# Patient Record
Sex: Male | Born: 1955 | Race: White | Hispanic: No | Marital: Single | Smoking: Former smoker
Health system: Southern US, Community
[De-identification: ages and names within clinical notes are randomized; demographics above are authoritative.]

## PROBLEM LIST (undated history)

## (undated) DIAGNOSIS — H409 Unspecified glaucoma: Secondary | ICD-10-CM

## (undated) DIAGNOSIS — K219 Gastro-esophageal reflux disease without esophagitis: Secondary | ICD-10-CM

## (undated) DIAGNOSIS — T7840XA Allergy, unspecified, initial encounter: Secondary | ICD-10-CM

## (undated) DIAGNOSIS — E785 Hyperlipidemia, unspecified: Secondary | ICD-10-CM

## (undated) HISTORY — DX: Allergy, unspecified, initial encounter: T78.40XA

## (undated) HISTORY — PX: EYE SURGERY: SHX253

## (undated) HISTORY — PX: CARPAL TUNNEL RELEASE: SHX101

## (undated) HISTORY — PX: AMPUTATION: SHX166

## (undated) HISTORY — DX: Hyperlipidemia, unspecified: E78.5

## (undated) HISTORY — DX: Unspecified glaucoma: H40.9

## (undated) HISTORY — DX: Gastro-esophageal reflux disease without esophagitis: K21.9

---

## 1997-03-24 DIAGNOSIS — C801 Malignant (primary) neoplasm, unspecified: Secondary | ICD-10-CM

## 1997-03-24 HISTORY — DX: Malignant (primary) neoplasm, unspecified: C80.1

## 2006-03-31 ENCOUNTER — Emergency Department (HOSPITAL_COMMUNITY): Admission: EM | Admit: 2006-03-31 | Discharge: 2006-03-31 | Payer: Self-pay | Admitting: Family Medicine

## 2013-05-27 ENCOUNTER — Encounter: Payer: Self-pay | Admitting: Gastroenterology

## 2013-07-15 ENCOUNTER — Telehealth: Payer: Self-pay | Admitting: *Deleted

## 2013-07-15 NOTE — Telephone Encounter (Signed)
Phone call to patient regarding missed previsit appointment today 800 am. Pt in [place] and states that appointments had been scheduled by primary care provider and he was unaware. Gave him the date his colonoscopy. He will call back this afternoon to reschedule previsit when he has his calendars. Informed him if previsit was not rescheduled this afternoon, colonoscopy would also be cancelled and he would need to reschedule both appointments.

## 2013-07-20 VITALS — Ht 71.0 in | Wt 215.0 lb

## 2013-07-20 MED ORDER — SOD PICOSULFATE-MAG OX-CIT ACD 10-3.5-12 MG-GM-GM PO PACK: 1.0000 | PACK | Freq: Once | ORAL | Status: DC

## 2013-07-20 NOTE — Progress Notes (Signed)
No allergies to eggs or soy No home oxygen No diet/weight loss meds No past problems with anesthesia  Has email  Emmi instructions given for colonoscopy 

## 2013-07-21 NOTE — Progress Notes (Signed)
Pt unable to find carepartner.  Stated he was going to [place] where the physicians would not require a carepartner to stay in the waiting room the whole time. This encounter was created in error - please disregard.

## 2013-07-29 ENCOUNTER — Encounter: Payer: BC Managed Care – PPO | Admitting: Gastroenterology

## 2013-07-29 ENCOUNTER — Ambulatory Visit
Admission: RE | Admit: 2013-07-29 | Discharge: 2013-07-29 | Disposition: A | Discharge status: Home / Self Care | Payer: BC Managed Care – PPO | Source: Ambulatory Visit | Attending: Family Medicine | Admitting: Family Medicine

## 2013-07-29 ENCOUNTER — Other Ambulatory Visit: Payer: Self-pay | Admitting: Family Medicine

## 2013-07-29 DIAGNOSIS — C499 Malignant neoplasm of connective and soft tissue, unspecified: Secondary | ICD-10-CM

## 2013-07-29 DIAGNOSIS — M25521 Pain in right elbow: Secondary | ICD-10-CM

## 2013-07-29 DIAGNOSIS — M79631 Pain in right forearm: Secondary | ICD-10-CM

## 2014-08-15 ENCOUNTER — Ambulatory Visit
Admission: RE | Admit: 2014-08-15 | Discharge: 2014-08-15 | Disposition: A | Discharge status: Home / Self Care | Payer: BLUE CROSS/BLUE SHIELD | Source: Ambulatory Visit | Attending: Family Medicine | Admitting: Family Medicine

## 2014-08-15 ENCOUNTER — Other Ambulatory Visit: Payer: Self-pay | Admitting: Family Medicine

## 2014-08-15 DIAGNOSIS — C499 Malignant neoplasm of connective and soft tissue, unspecified: Secondary | ICD-10-CM

## 2017-03-18 ENCOUNTER — Ambulatory Visit
Admission: RE | Admit: 2017-03-18 | Discharge: 2017-03-18 | Disposition: A | Discharge status: Home / Self Care | Payer: BLUE CROSS/BLUE SHIELD | Source: Ambulatory Visit | Attending: Family Medicine | Admitting: Family Medicine

## 2017-03-18 ENCOUNTER — Other Ambulatory Visit: Payer: Self-pay | Admitting: Family Medicine

## 2017-03-18 DIAGNOSIS — C499 Malignant neoplasm of connective and soft tissue, unspecified: Secondary | ICD-10-CM

## 2018-12-20 ENCOUNTER — Other Ambulatory Visit: Payer: Self-pay | Admitting: Family Medicine

## 2018-12-20 ENCOUNTER — Ambulatory Visit
Admission: RE | Admit: 2018-12-20 | Discharge: 2018-12-20 | Disposition: A | Discharge status: Home / Self Care | Payer: BLUE CROSS/BLUE SHIELD | Source: Ambulatory Visit | Attending: Family Medicine | Admitting: Family Medicine

## 2018-12-20 ENCOUNTER — Other Ambulatory Visit: Payer: Self-pay

## 2018-12-20 DIAGNOSIS — R52 Pain, unspecified: Secondary | ICD-10-CM

## 2018-12-20 DIAGNOSIS — Z85831 Personal history of malignant neoplasm of soft tissue: Secondary | ICD-10-CM

## 2020-02-01 ENCOUNTER — Other Ambulatory Visit: Payer: Self-pay

## 2020-02-01 ENCOUNTER — Other Ambulatory Visit: Payer: Self-pay | Admitting: Family Medicine

## 2020-02-01 ENCOUNTER — Ambulatory Visit
Admission: RE | Admit: 2020-02-01 | Discharge: 2020-02-01 | Disposition: A | Discharge status: Home / Self Care | Payer: BC Managed Care – PPO | Source: Ambulatory Visit | Attending: Family Medicine | Admitting: Family Medicine

## 2020-02-01 DIAGNOSIS — L989 Disorder of the skin and subcutaneous tissue, unspecified: Secondary | ICD-10-CM

## 2020-02-01 DIAGNOSIS — C499 Malignant neoplasm of connective and soft tissue, unspecified: Secondary | ICD-10-CM

## 2021-03-24 IMAGING — CR DG CHEST 2V
2 series · 2 of 2 positions shown · non-contrast
Comparison: Chest x-ray 12/20/2018.

CLINICAL DATA: 64-year-old male with history of sarcoma.

EXAM:
CHEST - 2 VIEW

[w chest pa]
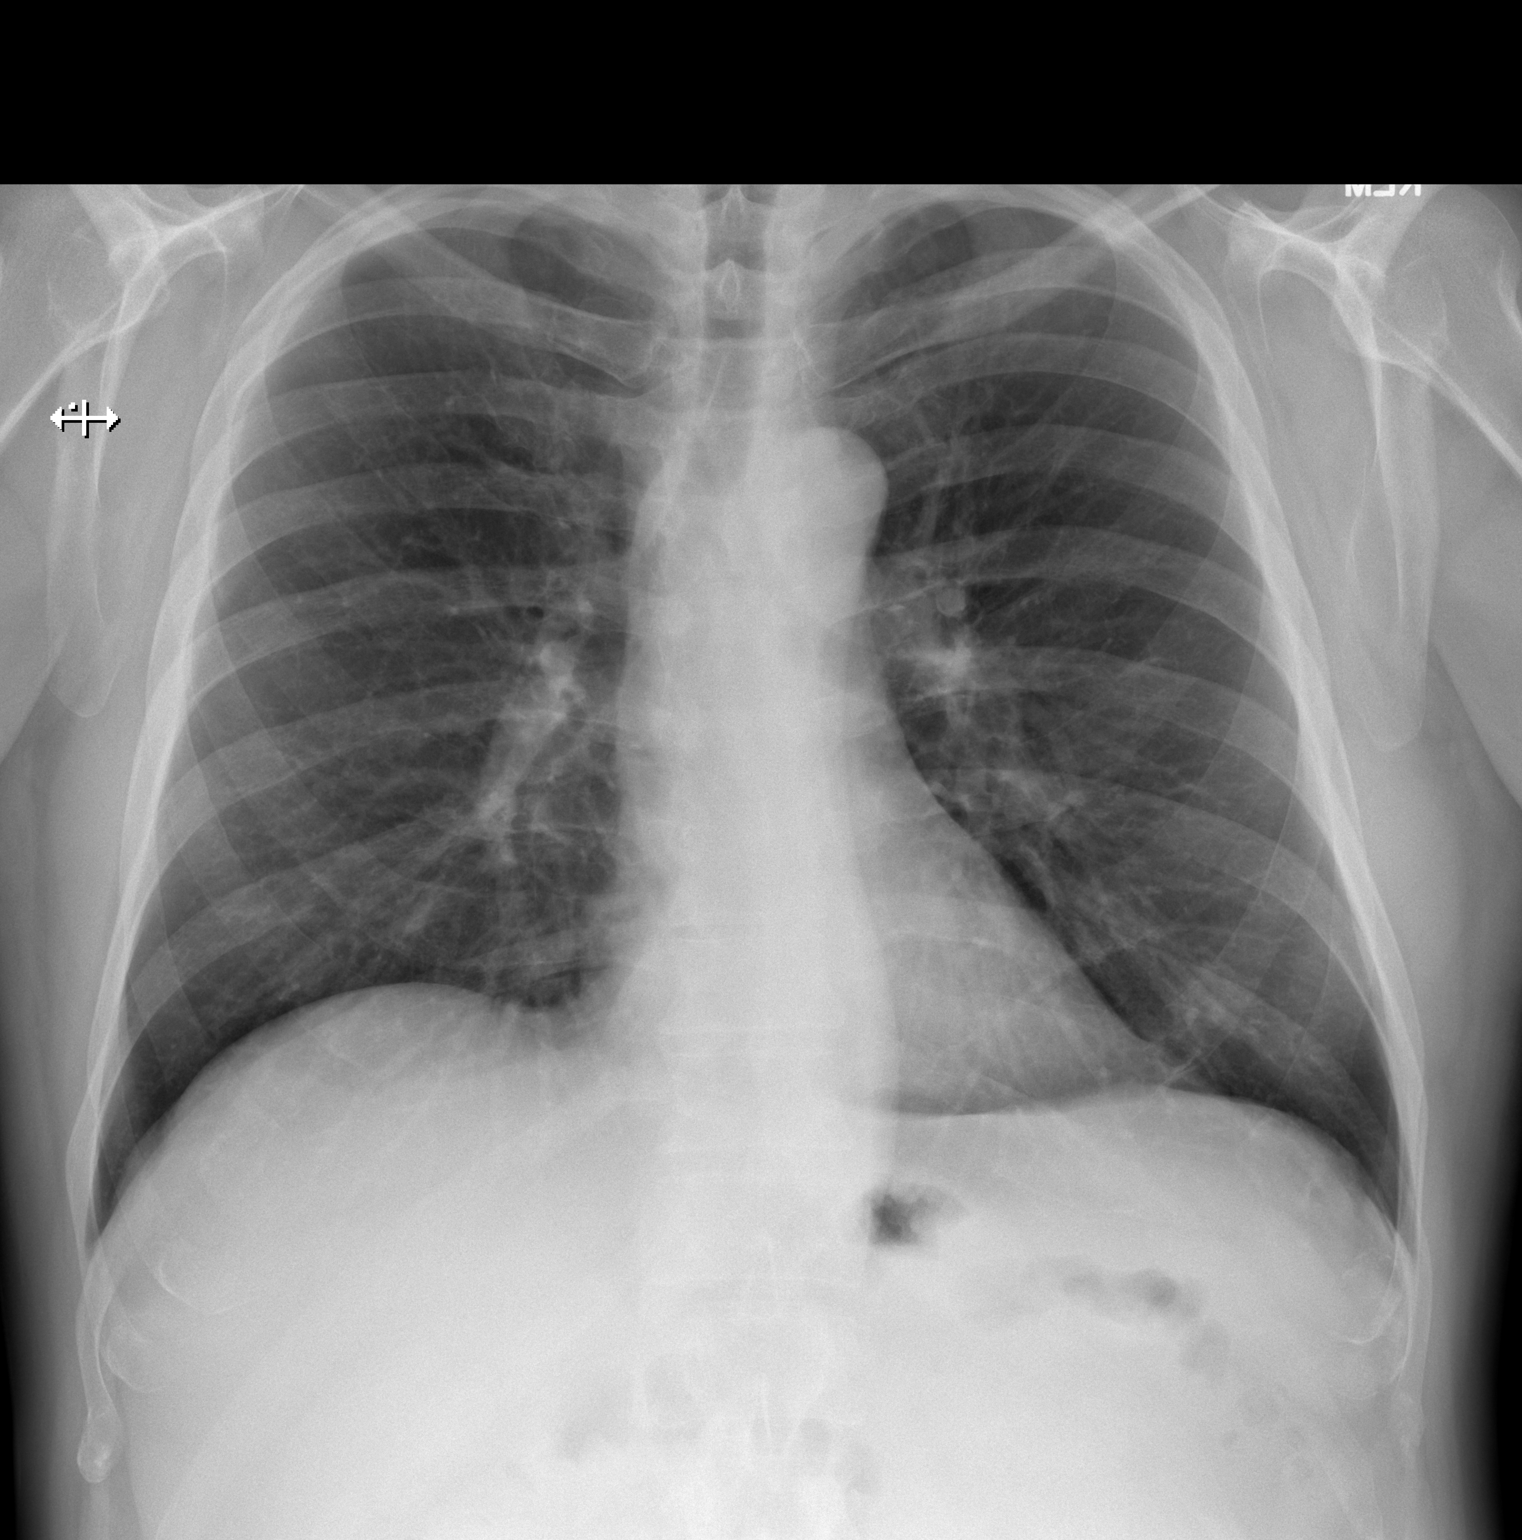

[w chest lat]
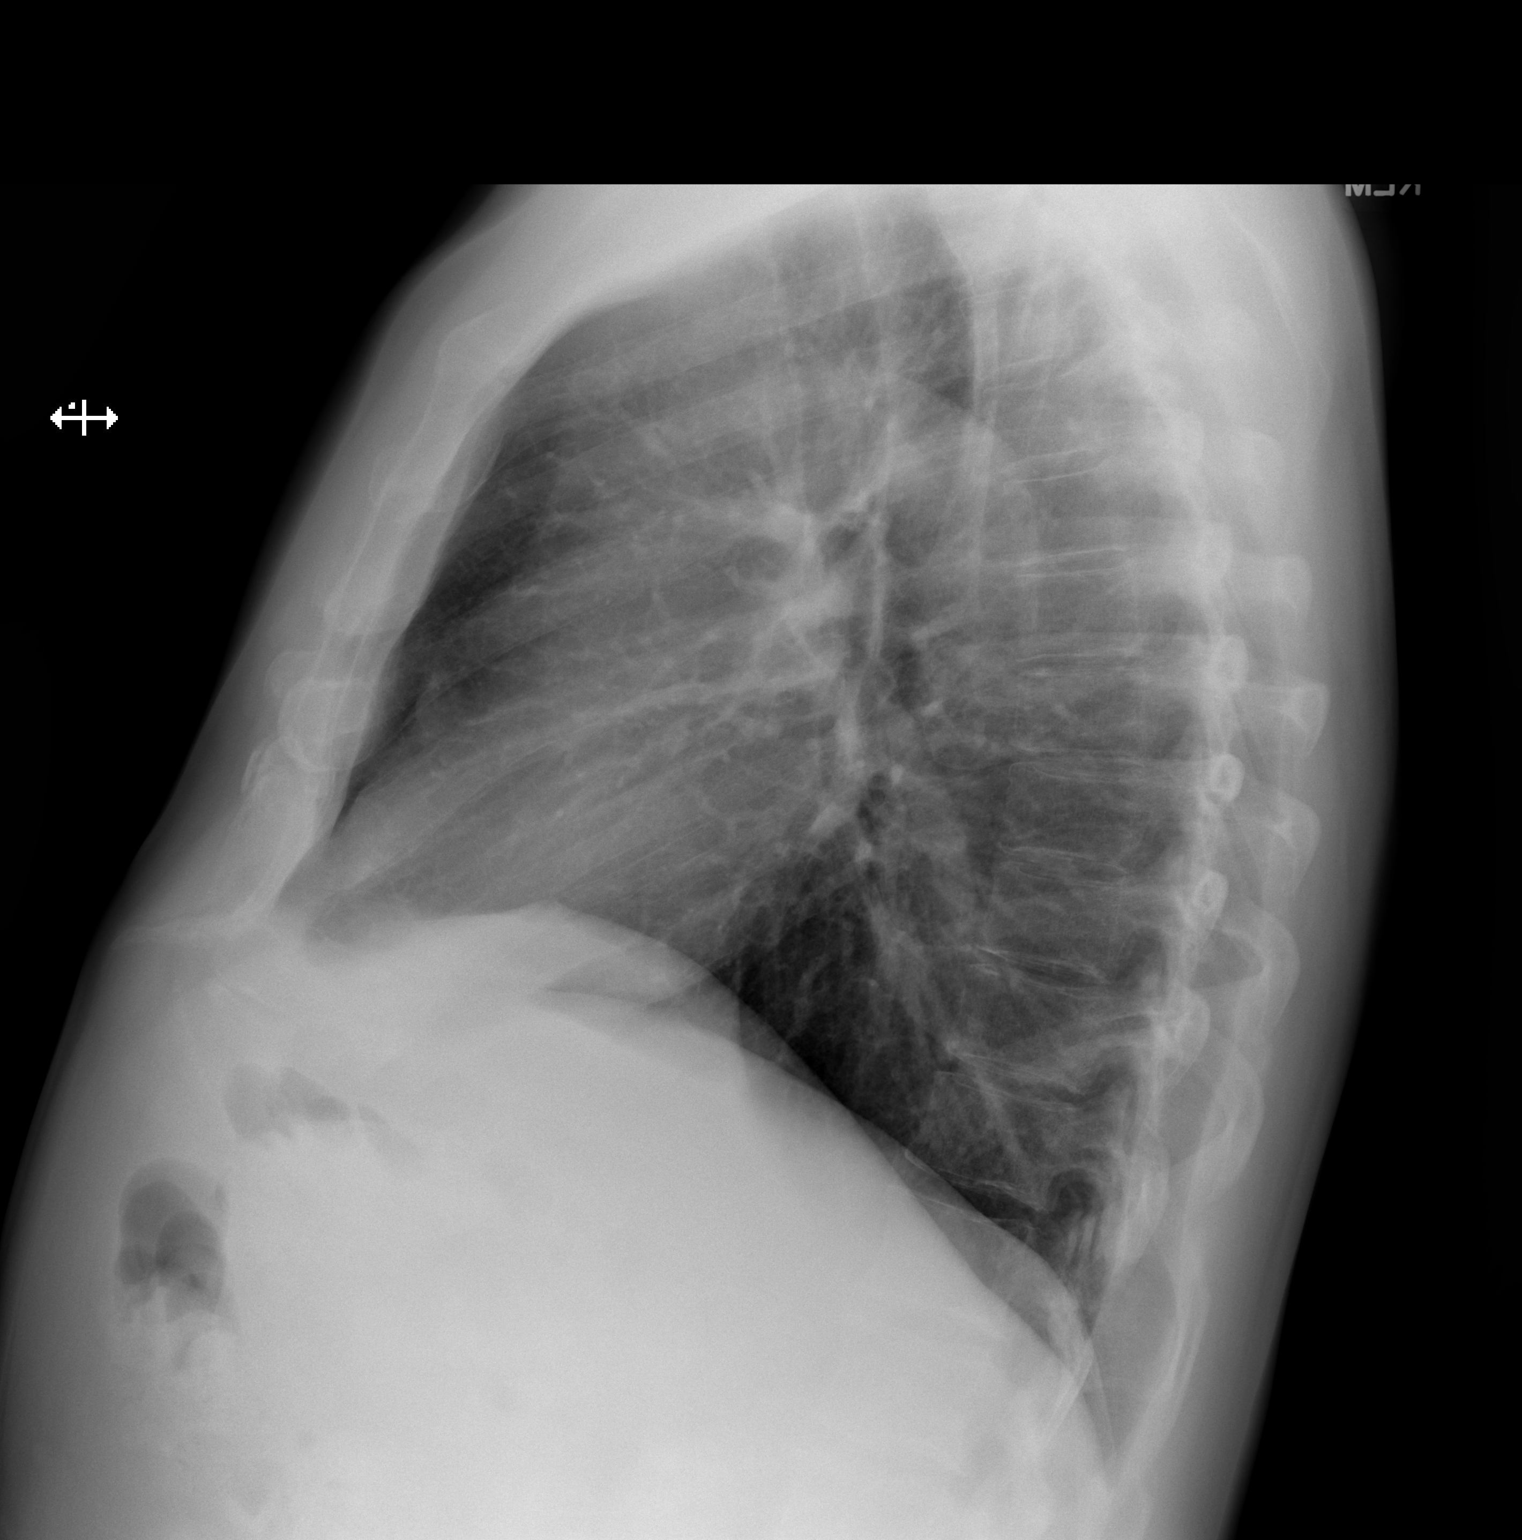

[2 of 2 positions shown; findings below may reference images not displayed]

FINDINGS: Lung volumes are normal. No consolidative airspace disease. No
pleural effusions. No pneumothorax. No pulmonary nodule or mass
noted. Pulmonary vasculature and the cardiomediastinal silhouette
are within normal limits.
IMPRESSION: No radiographic evidence of acute cardiopulmonary disease.

## 2021-03-24 IMAGING — CR DG FOREARM 2V*R*
2 series · 2 of 2 positions shown · non-contrast
Comparison: Right forearm x-rays dated December 20, 2018.

CLINICAL DATA: History of right distal arm a PT shin for sarcoma.

EXAM:
RIGHT FOREARM - 2 VIEW

[x forearm ap right]
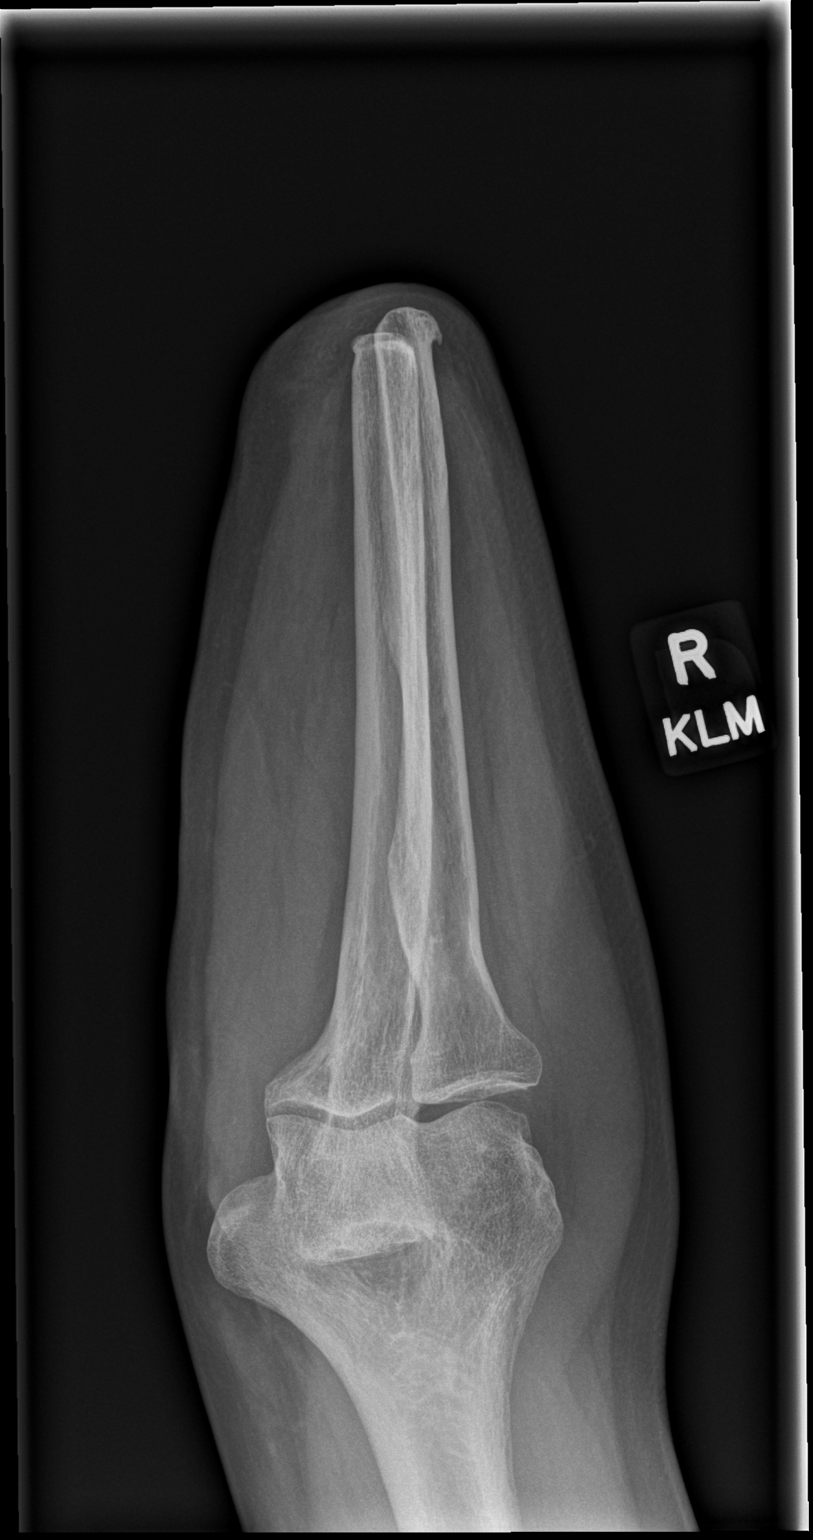

[x forearm lat right]
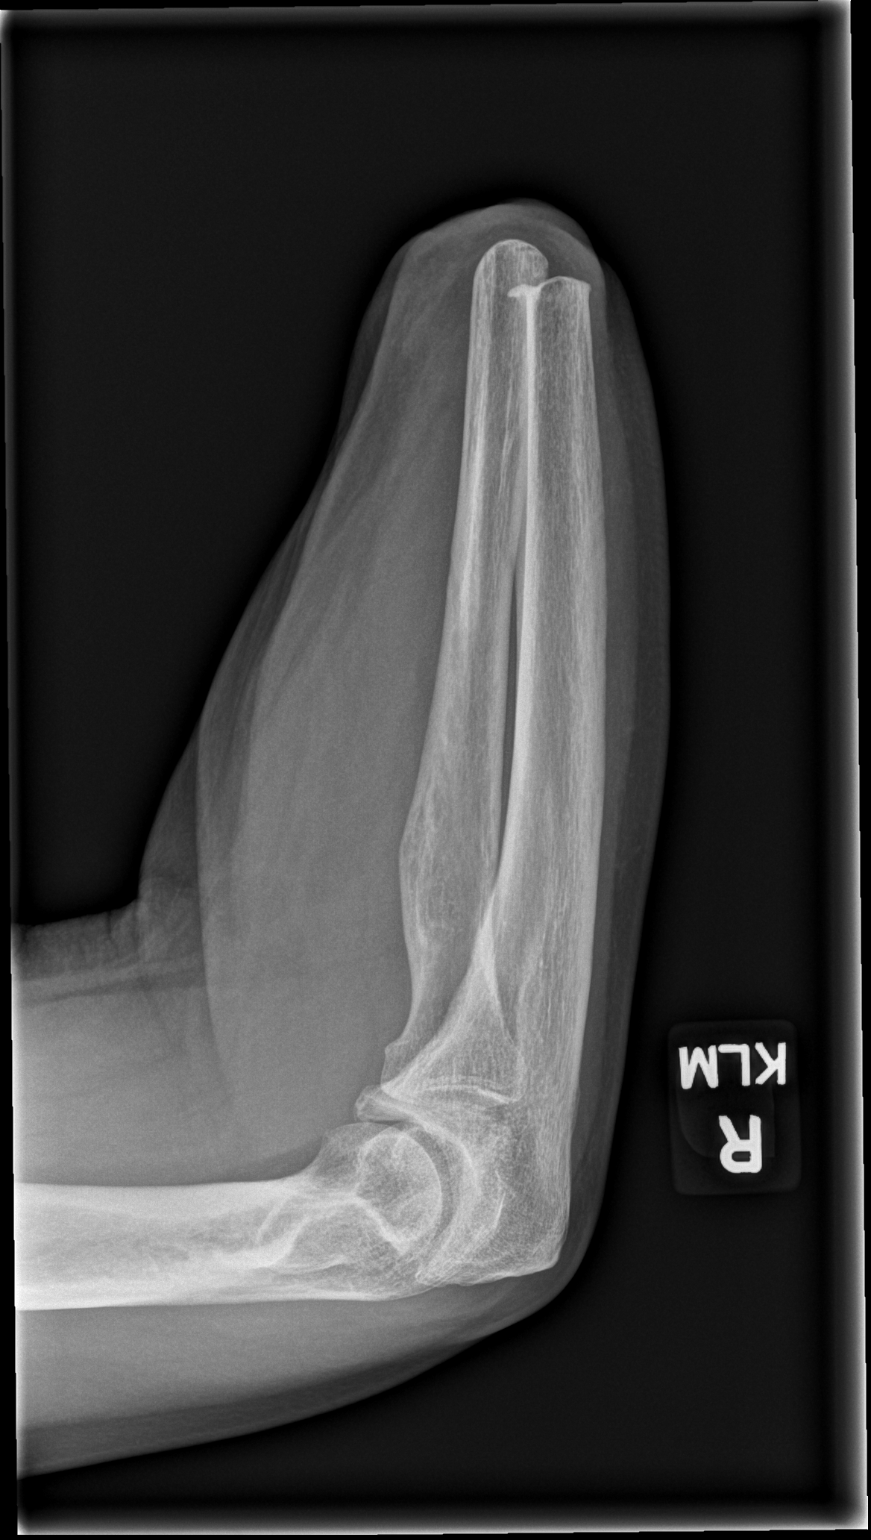

[2 of 2 positions shown; findings below may reference images not displayed]

FINDINGS: Partial mid forearm amputation. There is no evidence of fracture or
other focal bone lesions. Soft tissues are unremarkable.
IMPRESSION: 1. Prior mid forearm amputation without acute abnormality.

## 2023-02-17 ENCOUNTER — Other Ambulatory Visit: Payer: Self-pay | Admitting: Family Medicine

## 2023-02-17 ENCOUNTER — Ambulatory Visit
Admission: RE | Admit: 2023-02-17 | Discharge: 2023-02-17 | Disposition: A | Discharge status: Home / Self Care | Payer: BC Managed Care – PPO | Source: Ambulatory Visit | Attending: Family Medicine | Admitting: Family Medicine

## 2023-02-17 DIAGNOSIS — C4001 Malignant neoplasm of scapula and long bones of right upper limb: Secondary | ICD-10-CM

## 2023-02-17 DIAGNOSIS — Z0000 Encounter for general adult medical examination without abnormal findings: Secondary | ICD-10-CM

## 2023-06-02 ENCOUNTER — Telehealth: Payer: Self-pay | Admitting: Gastroenterology

## 2023-06-02 ENCOUNTER — Encounter: Payer: Self-pay | Admitting: Gastroenterology

## 2023-06-02 NOTE — Telephone Encounter (Signed)
 He had an adenoma removed in 2018 per Dr. Kinnie Scales - due for surveillance exam at this time. Okay to direct book in the [facility] with me for colonoscopy if he otherwise meets criteria to have his case done in the [facility]. thanks

## 2023-06-02 NOTE — Telephone Encounter (Signed)
 Good morning Dr. Adela Lank,   We received a referral for this patient to be scheduled for a colonoscopy with you due to being recommended. Patient last had a colonoscopy in 11/2016 with Dr. Kinnie Scales. Since his retirement, patient is wishing to establish GI care with [facility]. Records were obtained and scanned into Media for you to review. Would you please review and advise on scheduling?  Thank you

## 2023-07-28 ENCOUNTER — Ambulatory Visit

## 2023-07-28 VITALS — Ht 71.0 in | Wt 203.0 lb

## 2023-07-28 DIAGNOSIS — Z860100 Personal history of colon polyps, unspecified: Secondary | ICD-10-CM

## 2023-07-28 MED ORDER — NA SULFATE-K SULFATE-MG SULF 17.5-3.13-1.6 GM/177ML PO SOLN: 1.0000 | Freq: Once | ORAL | 0 refills | Status: AC

## 2023-07-28 NOTE — Progress Notes (Signed)

## 2023-07-29 ENCOUNTER — Encounter: Payer: Self-pay | Admitting: Gastroenterology

## 2023-08-03 ENCOUNTER — Telehealth: Payer: Self-pay | Admitting: Gastroenterology

## 2023-08-03 NOTE — Telephone Encounter (Signed)
 Good morning Dr. General Kenner, I received a call from this patient canceling his procedure for May the 20 th, due to him having a death in the family. Patient stated that he will call back to reschedule his procedure. Please advise.

## 2023-08-03 NOTE — Telephone Encounter (Signed)
Got it, thanks for letting me know

## 2023-08-11 ENCOUNTER — Encounter: Admitting: Gastroenterology

## 2023-09-23 ENCOUNTER — Telehealth: Payer: Self-pay | Admitting: Gastroenterology

## 2023-09-23 MED ORDER — NA SULFATE-K SULFATE-MG SULF 17.5-3.13-1.6 GM/177ML PO SOLN: 1.0000 | Freq: Once | ORAL | 0 refills | Status: AC

## 2023-09-23 NOTE — Telephone Encounter (Signed)
 PT is calling to have Suprep sent in to [facility] for his colonoscopy on 7/9 . Please advise.

## 2023-09-23 NOTE — Telephone Encounter (Signed)
 Informed pt that med wsa sent to requested pharmacy.

## 2023-09-30 ENCOUNTER — Telehealth: Payer: Self-pay | Admitting: Gastroenterology

## 2023-09-30 ENCOUNTER — Encounter: Payer: Self-pay | Admitting: Gastroenterology

## 2023-09-30 NOTE — Telephone Encounter (Signed)
 Good Morning Dr. Federico, this patient called and stated that he can not make his colonoscopy procedure for tomorrow and was rescheduled for August the 27 th.

## 2023-10-01 ENCOUNTER — Encounter: Admitting: Internal Medicine

## 2023-11-18 ENCOUNTER — Ambulatory Visit: Admitting: Internal Medicine

## 2023-11-18 ENCOUNTER — Encounter: Payer: Self-pay | Admitting: Internal Medicine

## 2023-11-18 VITALS — BP 138/77 | HR 77 | Temp 98.0°F | Resp 15 | Ht 71.0 in | Wt 203.0 lb

## 2023-11-18 DIAGNOSIS — K648 Other hemorrhoids: Secondary | ICD-10-CM | POA: Diagnosis not present

## 2023-11-18 DIAGNOSIS — K5730 Diverticulosis of large intestine without perforation or abscess without bleeding: Secondary | ICD-10-CM

## 2023-11-18 DIAGNOSIS — D122 Benign neoplasm of ascending colon: Secondary | ICD-10-CM

## 2023-11-18 DIAGNOSIS — Z1211 Encounter for screening for malignant neoplasm of colon: Secondary | ICD-10-CM | POA: Diagnosis present

## 2023-11-18 DIAGNOSIS — K529 Noninfective gastroenteritis and colitis, unspecified: Secondary | ICD-10-CM

## 2023-11-18 DIAGNOSIS — D125 Benign neoplasm of sigmoid colon: Secondary | ICD-10-CM | POA: Diagnosis not present

## 2023-11-18 DIAGNOSIS — Z860100 Personal history of colon polyps, unspecified: Secondary | ICD-10-CM

## 2023-11-18 DIAGNOSIS — K633 Ulcer of intestine: Secondary | ICD-10-CM | POA: Diagnosis not present

## 2023-11-18 DIAGNOSIS — D123 Benign neoplasm of transverse colon: Secondary | ICD-10-CM

## 2023-11-18 MED ORDER — SODIUM CHLORIDE 0.9 % IV SOLN: 500.0000 mL | Freq: Once | INTRAVENOUS | Status: DC

## 2023-11-18 NOTE — Progress Notes (Signed)
 Report to PACU, RN, vss, BBS= Clear.

## 2023-11-18 NOTE — Progress Notes (Signed)
 GASTROENTEROLOGY PROCEDURE H&P NOTE   Primary Care Physician: Windy Coy, MD    Reason for Procedure:   Colon cancer screening  Plan:    Colonoscopy  Patient is appropriate for endoscopic procedure(s) in the ambulatory ([facility]) setting.  The nature of the procedure, as well as the risks, benefits, and alternatives were carefully and thoroughly reviewed with the patient. Ample time for discussion and questions allowed. The patient understood, was satisfied, and agreed to proceed.     HPI: Nicholas Ruiz is a 68 y.o. male who presents for colonoscopy for colon cancer screening. Denies blood in stools, changes in bowel habits, or unintentional weight loss. Denies family history of colon cancer. Last colonoscopy was in 2018 with some benign colon polyps and repeat recommended in 5 years.   Past Medical History:  Diagnosis Date   Allergy    Cancer (HCC) 1999   Epithelioid Carcinoma Right wrist(Amputation)   GERD (gastroesophageal reflux disease)    Glaucoma    Hyperlipidemia     Past Surgical History:  Procedure Laterality Date   AMPUTATION     right arm 2nd-ary to cancer   CARPAL TUNNEL RELEASE     EYE SURGERY     laser and cryo in 2000    Prior to Admission medications   Medication Sig Start Date End Date Taking? Authorizing Provider  cyanocobalamin (VITAMIN B12) 1000 MCG tablet Take 1,000 mcg by mouth. 09/12/15  Yes [provider]  ezetimibe (ZETIA) 10 MG tablet Take 10 mg by mouth daily. 06/07/23  Yes [provider]  Ginkgo Biloba 40 MG TABS Take by mouth.   Yes [provider]  Multiple Vitamins-Minerals (CENTRUM SILVER PO) Take by mouth.   Yes [provider]  OVER THE COUNTER MEDICATION Eye gtts for glaucoma left eye   Yes [provider]  Probiotic Product (PROBIOTIC 10 ULTRA STRENGTH PO) Take by mouth.   Yes [provider]  rosuvastatin (CRESTOR) 40 MG tablet SMARTSIG:Tablet(s) By Mouth Every Evening  06/07/23  Yes [provider]  timolol (BETIMOL) 0.5 % ophthalmic solution Place 1 drop into the left eye 2 (two) times daily.   Yes [provider]  ALPRAZolam (XANAX) 0.5 MG tablet Take 0.5 mg by mouth at bedtime as needed.    [provider]  Cholecalciferol (VITAMIN D-3) 1000 UNITS CAPS Take by mouth.    [provider]  simvastatin (ZOCOR) 20 MG tablet Take 20 mg by mouth daily. Patient not taking: Reported on 07/28/2023    [provider]    Current Outpatient Medications  Medication Sig Dispense Refill   cyanocobalamin (VITAMIN B12) 1000 MCG tablet Take 1,000 mcg by mouth.     ezetimibe (ZETIA) 10 MG tablet Take 10 mg by mouth daily.     Ginkgo Biloba 40 MG TABS Take by mouth.     Multiple Vitamins-Minerals (CENTRUM SILVER PO) Take by mouth.     OVER THE COUNTER MEDICATION Eye gtts for glaucoma left eye     Probiotic Product (PROBIOTIC 10 ULTRA STRENGTH PO) Take by mouth.     rosuvastatin (CRESTOR) 40 MG tablet SMARTSIG:Tablet(s) By Mouth Every Evening     timolol (BETIMOL) 0.5 % ophthalmic solution Place 1 drop into the left eye 2 (two) times daily.     ALPRAZolam (XANAX) 0.5 MG tablet Take 0.5 mg by mouth at bedtime as needed.     Cholecalciferol (VITAMIN D-3) 1000 UNITS CAPS Take by mouth.     simvastatin (ZOCOR) 20 MG  tablet Take 20 mg by mouth daily. (Patient not taking: Reported on 07/28/2023)     Current Facility-Administered Medications  Medication Dose Route Frequency Provider Last Rate Last Admin   0.9 %  sodium chloride  infusion  500 mL Intravenous Once Federico Rosario BROCKS, MD        Allergies as of 11/18/2023   (No Known Allergies)    Family History  Problem Relation Age of Onset   Colon cancer Neg Hx    Pancreatic cancer Neg Hx    Rectal cancer Neg Hx    Stomach cancer Neg Hx    Colon polyps Neg Hx    Esophageal cancer Neg Hx     Social History   Socioeconomic History   Marital status: Single    Spouse name: Not on  file   Number of children: Not on file   Years of education: Not on file   Highest education level: Not on file  Occupational History   Not on file  Tobacco Use   Smoking status: Former   Smokeless tobacco: Former    Quit date: 07/20/1997   Tobacco comments:    16 yrs ago  Vaping Use   Vaping status: Never Used  Substance and Sexual Activity   Alcohol use: Yes    Alcohol/week: 14.0 standard drinks of alcohol    Types: 14 drink(s) per week    Comment: mixture of beer, wine, liquor   Drug use: No   Sexual activity: Not on file  Other Topics Concern   Not on file  Social History Narrative   Not on file   Social Drivers of Health   Financial Resource Strain: Not on file  Food Insecurity: Not on file  Transportation Needs: Not on file  Physical Activity: Not on file  Stress: Not on file  Social Connections: Not on file  Intimate Partner Violence: Not on file    Physical Exam: Vital signs in last 24 hours: BP 133/79   Pulse 84   Temp 98 F (36.7 C)   Ht 5' 11 (1.803 m)   Wt 203 lb (92.1 kg)   SpO2 95%   BMI 28.31 kg/m  GEN: NAD EYE: Sclerae anicteric ENT: MMM CV: Non-tachycardic Pulm: No increased work of breathing GI: Soft, NT/ND NEURO:  Alert & Oriented   Estefana Federico, MD [facility]  11/18/2023 10:09 AM

## 2023-11-18 NOTE — Progress Notes (Signed)
 Called to room to assist during endoscopic procedure.  Patient ID and intended procedure confirmed with present staff. Received instructions for my participation in the procedure from the performing physician.

## 2023-11-18 NOTE — Patient Instructions (Signed)
-  Handout on polyps, diverticulosis and hemorrhoids  provided. -await pathology results. -repeat colonoscopy for surveillance recommended. Date to be determined when pathology result become available.  -Continue present medications.  YOU HAD AN ENDOSCOPIC PROCEDURE TODAY AT THE [facility]:   Refer to the procedure report that was given to you for any specific questions about what was found during the examination.  If the procedure report does not answer your questions, please call your gastroenterologist to clarify.  If you requested that your care partner not be given the details of your procedure findings, then the procedure report has been included in a sealed envelope for you to review at your convenience later.  YOU SHOULD EXPECT: Some feelings of bloating in the abdomen. Passage of more gas than usual.  Walking can help get rid of the air that was put into your GI tract during the procedure and reduce the bloating. If you had a lower endoscopy (such as a colonoscopy or flexible sigmoidoscopy) you may notice spotting of blood in your stool or on the toilet paper. If you underwent a bowel prep for your procedure, you may not have a normal bowel movement for a few days.  Please Note:  You might notice some irritation and congestion in your nose or some drainage.  This is from the oxygen used during your procedure.  There is no need for concern and it should clear up in a day or so.  SYMPTOMS TO REPORT IMMEDIATELY:  Following lower endoscopy (colonoscopy or flexible sigmoidoscopy):  Excessive amounts of blood in the stool  Significant tenderness or worsening of abdominal pains  Swelling of the abdomen that is new, acute  Fever of 100F or higher  polyps For urgent or emergent issues, a gastroenterologist can be reached at any hour by calling [phone]. Do not use MyChart messaging for urgent concerns.    DIET:  We do recommend a small meal at first, but then you may  proceed to your regular diet.  Drink plenty of fluids but you should avoid alcoholic beverages for 24 hours.  ACTIVITY:  You should plan to take it easy for the rest of today and you should NOT DRIVE or use heavy machinery until tomorrow (because of the sedation medicines used during the test).    FOLLOW UP: Our staff will call the number listed on your records the next business day following your procedure.  We will call around 7:15- 8:00 am to check on you and address any questions or concerns that you may have regarding the information given to you following your procedure. If we do not reach you, we will leave a message.     If any biopsies were taken you will be contacted by phone or by letter within the next 1-3 weeks.  Please call us  at [phone] if you have not heard about the biopsies in 3 weeks.    SIGNATURES/CONFIDENTIALITY: You and/or your care partner have signed paperwork which will be entered into your electronic medical record.  These signatures attest to the fact that that the information above on your After Visit Summary has been reviewed and is understood.  Full responsibility of the confidentiality of this discharge information lies with you and/or your care-partner.

## 2023-11-18 NOTE — Op Note (Addendum)
 [facility] Patient Name: Nicholas Ruiz Procedure Date: 11/18/2023 10:15 AM MRN: [id number] Endoscopist: Rosario Estefana Kidney , , [id number] Age: 68 Referring MD:  Date of Birth: [birth year] Gender: Male Account #: [id number] Procedure:                Colonoscopy Indications:              High risk colon cancer surveillance: Personal                            history of colonic polyps Medicines:                Monitored Anesthesia Care Procedure:                Pre-Anesthesia Assessment:                           - Prior to the procedure, a History and Physical                            was performed, and patient medications and                            allergies were reviewed. The patient's tolerance of                            previous anesthesia was also reviewed. The risks                            and benefits of the procedure and the sedation                            options and risks were discussed with the patient.                            All questions were answered, and informed consent                            was obtained. Prior Anticoagulants: The patient has                            taken no anticoagulant or antiplatelet agents. ASA                            Grade Assessment: II - A patient with mild systemic                            disease. After reviewing the risks and benefits,                            the patient was deemed in satisfactory condition to                            undergo the procedure.  After obtaining informed consent, the colonoscope                            was passed under direct vision. Throughout the                            procedure, the patient's blood pressure, pulse, and                            oxygen saturations were monitored continuously. The                            CF [id number] #[id number] was introduced through the anus                            and advanced to the the  terminal ileum. The                            colonoscopy was performed without difficulty. The                            patient tolerated the procedure well. The quality                            of the bowel preparation was excellent. The                            terminal ileum, ileocecal valve, appendiceal                            orifice, and rectum were photographed. Scope In: 10:19:35 AM Scope Out: 10:35:42 AM Scope Withdrawal Time: 0 hours 14 minutes 5 seconds  Total Procedure Duration: 0 hours 16 minutes 7 seconds  Findings:                 Localized inflammation characterized by congestion                            (edema), erosions, erythema and serpentine                            ulcerations was found in the terminal ileum.                           Three sessile polyps were found in the sigmoid                            colon, transverse colon and ascending colon. The                            polyps were 3 to 6 mm in size. These polyps were                            removed with a cold snare. Resection and retrieval  were complete.                           A few diverticula were found in the sigmoid colon.                           Non-bleeding internal hemorrhoids were found during                            retroflexion. Complications:            No immediate complications. Estimated Blood Loss:     Estimated blood loss was minimal. Impression:               - Inflammation was found in the ileum secondary to                            ileitis.                           - Three 3 to 6 mm polyps in the sigmoid colon, in                            the transverse colon and in the ascending colon,                            removed with a cold snare. Resected and retrieved.                           - Diverticulosis in the sigmoid colon.                           - Non-bleeding internal hemorrhoids. Recommendation:           - Discharge  patient to home (with escort).                           - Await pathology results.                           - The findings and recommendations were discussed                            with the patient. Dr Estefana Federico Rosario Estefana Federico,  11/18/2023 10:44:30 AM Addendum Number: 1   Addendum Date: 11/30/2023 10:52:23 AM      Ileal biopsies were performed. Dr Estefana Federico Rosario Estefana Federico,  11/30/2023 10:52:34 AM

## 2023-11-18 NOTE — Progress Notes (Signed)
 Pt's states no medical or surgical changes since previsit or office visit.

## 2023-11-20 LAB — SURGICAL PATHOLOGY

## 2023-11-24 ENCOUNTER — Ambulatory Visit: Payer: Self-pay | Admitting: Internal Medicine
